# Patient Record
Sex: Male | Born: 1995 | Marital: Single | Smoking: Current some day smoker
Health system: Southern US, Community
[De-identification: ages and names within clinical notes are randomized; demographics above are authoritative.]

## PROBLEM LIST (undated history)

## (undated) DIAGNOSIS — F909 Attention-deficit hyperactivity disorder, unspecified type: Secondary | ICD-10-CM

## (undated) HISTORY — DX: Attention-deficit hyperactivity disorder, unspecified type: F90.9

## (undated) HISTORY — PX: NO PAST SURGERIES: SHX2092

---
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
## 2014-01-21 ENCOUNTER — Institutional Professional Consult (permissible substitution) (INDEPENDENT_AMBULATORY_CARE_PROVIDER_SITE_OTHER): Payer: BC Managed Care – PPO | Admitting: Family

## 2014-01-21 DIAGNOSIS — F913 Oppositional defiant disorder: Secondary | ICD-10-CM

## 2014-01-21 DIAGNOSIS — F909 Attention-deficit hyperactivity disorder, unspecified type: Secondary | ICD-10-CM

## 2019-11-18 ENCOUNTER — Encounter: Payer: Self-pay | Admitting: Family Medicine

## 2019-11-19 ENCOUNTER — Encounter: Payer: Self-pay | Admitting: Family Medicine

## 2021-03-29 ENCOUNTER — Ambulatory Visit (INDEPENDENT_AMBULATORY_CARE_PROVIDER_SITE_OTHER): Payer: Self-pay | Admitting: Nurse Practitioner

## 2021-03-29 ENCOUNTER — Other Ambulatory Visit: Payer: Self-pay

## 2021-03-29 ENCOUNTER — Encounter: Payer: Self-pay | Admitting: Nurse Practitioner

## 2021-03-29 VITALS — BP 126/80 | HR 73 | Temp 99.6°F | Resp 20 | Ht 67.0 in | Wt 132.0 lb

## 2021-03-29 DIAGNOSIS — Z139 Encounter for screening, unspecified: Secondary | ICD-10-CM | POA: Insufficient documentation

## 2021-03-29 DIAGNOSIS — F1113 Opioid abuse with withdrawal: Secondary | ICD-10-CM

## 2021-03-29 DIAGNOSIS — F909 Attention-deficit hyperactivity disorder, unspecified type: Secondary | ICD-10-CM

## 2021-03-29 DIAGNOSIS — Z7689 Persons encountering health services in other specified circumstances: Secondary | ICD-10-CM | POA: Insufficient documentation

## 2021-03-29 NOTE — Progress Notes (Signed)
New Patient Office Visit  Subjective:  Patient ID: Jayse T. Sonnen, male    DOB: Mar 22, 1996  Age: 25 y.o. MRN: [id number]  CC:  Chief Complaint  Patient presents with  . New Patient (Initial Visit)    HPI Kaelum T. Fosse presents for new patient visit. Transferring care from [place] facility. Last physical was 2 years ago Last labs were drawn just prior to his release. He states he had HIV and HCV testing.  He states that he has a mental health provider now named Ms. Green, so he has someone to talk for therapy. He is currently on parole, so he has to have negative drug tests, and he is motivated to stay clean.   Past Medical History:  Diagnosis Date  . ADHD   . ADHD (attention deficit hyperactivity disorder)   . ADHD (attention deficit hyperactivity disorder)     Past Surgical History:  Procedure Laterality Date  . NO PAST SURGERIES      History reviewed. No pertinent family history.  Social History   Socioeconomic History  . Marital status: Single    Spouse name: Not on file  . Number of children: 0  . Years of education: Not on file  . Highest education level: Not on file  Occupational History    Comment: Cabinet making  Tobacco Use  . Smoking status: Current Some Day Smoker    Packs/day: 0.25  . Smokeless tobacco: Never Used  Vaping Use  . Vaping Use: Never used  Substance and Sexual Activity  . Alcohol use: Not Currently    Comment: maybe 2 beers per week  . Drug use: Yes    Types: Heroin    Comment: percocet  . Sexual activity: Yes    Birth control/protection: Condom  Other Topics Concern  . Not on file  Social History Narrative  . Not on file   Social Determinants of Health   Financial Resource Strain: Not on file  Food Insecurity: Not on file  Transportation Needs: Not on file  Physical Activity: Not on file  Stress: Not on file  Social Connections: Not on file  Intimate Partner Violence: Not on file    ROS Review of  Systems  Constitutional: Negative.   Respiratory: Negative.   Cardiovascular: Negative.   Psychiatric/Behavioral: Negative for self-injury and suicidal ideas.       Having mild withdrawal from opioids; requesting suboxone therapy    Objective:   Today's Vitals: BP 126/80   Pulse 73   Temp 99.6 F (37.6 C)   Resp 20   Ht _0  (1.702 m)   Wt 132 lb (59.9 kg)   SpO2 97%   BMI 20.67 kg/m   Physical Exam Constitutional:      Appearance: Normal appearance.  Cardiovascular:     Rate and Rhythm: Normal rate and regular rhythm.     Pulses: Normal pulses.     Heart sounds: Normal heart sounds.  Pulmonary:     Effort: Pulmonary effort is normal.     Breath sounds: Normal breath sounds.  Neurological:     Mental Status: He is alert.  Psychiatric:        Mood and Affect: Mood normal.        Behavior: Behavior normal.        Thought Content: Thought content normal.        Judgment: Judgment normal.     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[place])    -was in prison for 6 years and using opioids there -no that he is out, he does not have a supply and would like to quit -referral to pain mgmt -has therapist currently      Relevant Orders   Ambulatory referral to Pain Clinic     Other   ADHD (attention deficit hyperactivity disorder)    -no current medication -given hx of narcotic abuse, he would need specialist to start this      Encounter to establish care - Primary    -obtain records      Relevant Orders   CBC with Differential/Platelet   Lipid Panel With LDL/HDL Ratio   CMP14+EGFR   Screening due    -will screen for HCV and HIV with next set of labs         Outpatient Encounter Medications as of 03/29/2021  Medication Sig  . [DISCONTINUED] amphetamine-dextroamphetamine (ADDERALL XR) 30 MG 24 hr capsule Take 2 capsules (60 mg total) by mouth every morning.  . [DISCONTINUED]  guanFACINE (INTUNIV) 4 MG TB24 SR tablet Take 1 tablet (4 mg total) by mouth every morning.   No facility-administered encounter medications on file as of 03/29/2021.    Follow-up: Return in about 1 month (around 04/28/2021) for Physical Exam.   Noreene Larsson, NP

## 2021-03-29 NOTE — Assessment & Plan Note (Addendum)
-  was in prison for 6 years and using opioids there -no that he is out, he does not have a supply and would like to quit -referral to pain mgmt -has therapist currently

## 2021-03-29 NOTE — Assessment & Plan Note (Signed)
-  obtain records 

## 2021-03-29 NOTE — Assessment & Plan Note (Signed)
-  no current medication -given hx of narcotic abuse, he would need specialist to start this

## 2021-03-29 NOTE — Patient Instructions (Signed)
Please have fasting labs drawn 2-3 days prior to your appointment so we can discuss the results during your office visit.  

## 2021-03-29 NOTE — Assessment & Plan Note (Signed)
-  will screen for HCV and HIV with next set of labs 

## 2021-04-17 ENCOUNTER — Telehealth: Payer: Self-pay

## 2021-04-17 NOTE — Telephone Encounter (Signed)
Pts mom came in --pt has no insurance, she is paying the bill, she wants to know if the CPE and blood work can be pushed back until he gets some insurance.  Please call [phone] and advise

## 2021-04-17 NOTE — Telephone Encounter (Signed)
Left message

## 2021-04-18 NOTE — Telephone Encounter (Signed)
Pt informed

## 2021-04-26 ENCOUNTER — Encounter: Payer: Self-pay | Admitting: Nurse Practitioner

## 2022-03-17 ENCOUNTER — Encounter: Payer: Self-pay | Admitting: Emergency Medicine

## 2022-03-17 ENCOUNTER — Ambulatory Visit
Admission: EM | Admit: 2022-03-17 | Discharge: 2022-03-17 | Disposition: A | Discharge status: Home / Self Care | Payer: Self-pay | Attending: Family Medicine | Admitting: Family Medicine

## 2022-03-17 DIAGNOSIS — R300 Dysuria: Secondary | ICD-10-CM | POA: Insufficient documentation

## 2022-03-17 DIAGNOSIS — R369 Urethral discharge, unspecified: Secondary | ICD-10-CM | POA: Insufficient documentation

## 2022-03-17 LAB — POCT URINALYSIS DIP (MANUAL ENTRY)
Glucose, UA: NEGATIVE mg/dL
Ketones, POC UA: NEGATIVE mg/dL
Nitrite, UA: NEGATIVE
Protein Ur, POC: 30 mg/dL — AB
Spec Grav, UA: >=1.03 — AB (ref 1.010–1.025)
Urobilinogen, UA: 0.2 E.U./dL
pH, UA: 5.5 (ref 5.0–8.0)

## 2022-03-17 MED ORDER — CEFTRIAXONE SODIUM 500 MG IJ SOLR
500.0000 mg | Freq: Once | INTRAMUSCULAR | Status: AC
  Administered 2022-03-17: 500 mg via INTRAMUSCULAR

## 2022-03-17 MED ORDER — DOXYCYCLINE HYCLATE 100 MG PO CAPS: 100.0000 mg | ORAL_CAPSULE | Freq: Two times a day (BID) | ORAL | 0 refills | Status: DC

## 2022-03-17 NOTE — ED Triage Notes (Signed)
Penile drainage - white and green that started today.  Pain on urination x 2 days.

## 2022-03-17 NOTE — ED Provider Notes (Signed)
[facility]    CSN: [id number] Arrival date & time: 03/17/22  1246      History   Chief Complaint No chief complaint on file.   HPI Makenzie T. Lybrand is a 26 y.o. male.   Presenting today with 2-day history of dysuria, white and green penile discharge.  Denies abdominal pain, fever, chills, nausea, vomiting.  Does have any sexual partner.  No past history of similar issues.  Not trying anything over-the-counter for symptoms.   Past Medical History:  Diagnosis Date   ADHD    ADHD (attention deficit hyperactivity disorder)    ADHD (attention deficit hyperactivity disorder)     Patient Active Problem List   Diagnosis Date Noted   Encounter to establish care 03/29/2021   Screening due 03/29/2021   Opioid abuse with withdrawal (HCC) 03/29/2021   ADHD (attention deficit hyperactivity disorder) 05/08/2013    Past Surgical History:  Procedure Laterality Date   NO PAST SURGERIES         Home Medications    Prior to Admission medications   Medication Sig Start Date End Date Taking? Authorizing Provider  doxycycline (VIBRAMYCIN) 100 MG capsule Take 1 capsule (100 mg total) by mouth 2 (two) times daily. 03/17/22  Yes Particia Nearing, PA-C    Family History History reviewed. No pertinent family history.  Social History Social History   Tobacco Use   Smoking status: Some Days    Packs/day: 0.25    Types: Cigarettes   Smokeless tobacco: Never  Vaping Use   Vaping Use: Every day   Substances: Nicotine, Mixture of cannabinoids  Substance Use Topics   Alcohol use: Not Currently    Comment: maybe 2 beers per week   Drug use: Yes    Types: Methamphetamines    Comment: percocet     Allergies   Patient has no known allergies.   Review of Systems Review of Systems Per HPI  Physical Exam Triage Vital Signs ED Triage Vitals  Enc Vitals Group     BP 03/17/22 1355 114/76     Pulse Rate 03/17/22 1355 97     Resp 03/17/22 1355 18     Temp  03/17/22 1355 98.1 F (36.7 C)     Temp Source 03/17/22 1355 Oral     SpO2 03/17/22 1355 98 %     Weight --      Height --      Head Circumference --      Peak Flow --      Pain Score 03/17/22 1356 0     Pain Loc --      Pain Edu? --      Excl. in GC? --    No data found.  Updated Vital Signs BP 114/76 (BP Location: Right Arm)   Pulse 97   Temp 98.1 F (36.7 C) (Oral)   Resp 18   SpO2 98%   Visual Acuity Right Eye Distance:   Left Eye Distance:   Bilateral Distance:    Right Eye Near:   Left Eye Near:    Bilateral Near:     Physical Exam Vitals and nursing note reviewed.  Constitutional:      Appearance: Normal appearance.  HENT:     Head: Atraumatic.  Eyes:     Extraocular Movements: Extraocular movements intact.     Conjunctiva/sclera: Conjunctivae normal.  Cardiovascular:     Rate and Rhythm: Normal rate and regular rhythm.  Pulmonary:     Effort: Pulmonary  effort is normal.     Breath sounds: Normal breath sounds.  Abdominal:     General: Bowel sounds are normal. There is no distension.     Palpations: Abdomen is soft.     Tenderness: There is no abdominal tenderness. There is no right CVA tenderness, left CVA tenderness or guarding.  Genitourinary:    Comments: GU exam deferred, self swab performed Musculoskeletal:        General: Normal range of motion.     Cervical back: Normal range of motion and neck supple.  Skin:    General: Skin is warm and dry.  Neurological:     General: No focal deficit present.     Mental Status: He is oriented to person, place, and time.  Psychiatric:        Mood and Affect: Mood normal.        Thought Content: Thought content normal.        Judgment: Judgment normal.   UC Treatments / Results  Labs (all labs ordered are listed, but only abnormal results are displayed) Labs Reviewed  POCT URINALYSIS DIP (MANUAL ENTRY) - Abnormal; Notable for the following components:      Result Value   Clarity, UA hazy (*)     Bilirubin, UA small (*)    Spec Grav, UA >=1.030 (*)    Blood, UA small (*)    Protein Ur, POC =30 (*)    Leukocytes, UA Moderate (2+) (*)    All other components within normal limits  CYTOLOGY, (ORAL, ANAL, URETHRAL) ANCILLARY ONLY    EKG   Radiology No results found.  Procedures Procedures (including critical care time)  Medications Ordered in UC Medications  cefTRIAXone (ROCEPHIN) injection 500 mg (has no administration in time range)    Initial Impression / Assessment and Plan / UC Course  I have reviewed the triage vital signs and the nursing notes.  Pertinent labs & imaging results that were available during my care of the patient were reviewed by me and considered in my medical decision making (see chart for details).     Concerning for STD.  While awaiting cytology swab results, will cover for gonorrhea and chlamydia with IM Rocephin, doxycycline.  Discussed abstinence until results return and at least 7 days posttreatment.  Safe sexual practices reviewed.  Final Clinical Impressions(s) / UC Diagnoses   Final diagnoses:  Penile discharge  Dysuria   Discharge Instructions   None    ED Prescriptions     Medication Sig Dispense Auth. Provider   doxycycline (VIBRAMYCIN) 100 MG capsule Take 1 capsule (100 mg total) by mouth 2 (two) times daily. 14 capsule Particia Nearing, [place]      PDMP not reviewed this encounter.   Particia Nearing, [place] 03/17/22 1427

## 2022-03-20 LAB — CYTOLOGY, (ORAL, ANAL, URETHRAL) ANCILLARY ONLY
Chlamydia: NEGATIVE
Comment: NEGATIVE
Comment: NEGATIVE
Comment: NORMAL
Neisseria Gonorrhea: POSITIVE — AB
Trichomonas: NEGATIVE

## 2022-04-02 ENCOUNTER — Ambulatory Visit
Admission: EM | Admit: 2022-04-02 | Discharge: 2022-04-02 | Disposition: A | Discharge status: Home / Self Care | Payer: Self-pay | Attending: Nurse Practitioner | Admitting: Nurse Practitioner

## 2022-04-02 DIAGNOSIS — N342 Other urethritis: Secondary | ICD-10-CM | POA: Insufficient documentation

## 2022-04-02 DIAGNOSIS — S71112A Laceration without foreign body, left thigh, initial encounter: Secondary | ICD-10-CM | POA: Insufficient documentation

## 2022-04-02 MED ORDER — DOXYCYCLINE HYCLATE 100 MG PO CAPS: 100.0000 mg | ORAL_CAPSULE | Freq: Two times a day (BID) | ORAL | 0 refills | Status: DC
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
## 2022-04-02 NOTE — Discharge Instructions (Signed)
-   We will let you know if the swab comes back positive for anything; please start doxycycline 100 mg twice daily for 7 days in the meantime -This will also treat the laceration of your thigh if there is any infection in there -Please use condoms with every sexual encounter to prevent the spread of STI
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
## 2022-04-04 ENCOUNTER — Telehealth (HOSPITAL_COMMUNITY): Payer: Self-pay | Admitting: Emergency Medicine

## 2022-04-04 LAB — CYTOLOGY, (ORAL, ANAL, URETHRAL) ANCILLARY ONLY
Chlamydia: NEGATIVE
Comment: NEGATIVE
Comment: NEGATIVE
Comment: NORMAL
Neisseria Gonorrhea: POSITIVE — AB
Trichomonas: NEGATIVE

## 2022-04-04 NOTE — Telephone Encounter (Signed)
Per protocol, patient will need treatment with IM Rocephin  500 mg for positive Gonorrhea Contacted patient by phone.  Verified identity using two identifiers.  Provided positive result.  Reviewed safe sex practices, notifying partners, and refraining from sexual activities for 7 days from time of treatment.  Patient verified understanding, all questions answered.   [facility] notified 

## 2022-04-16 ENCOUNTER — Ambulatory Visit
Admission: EM | Admit: 2022-04-16 | Discharge: 2022-04-16 | Disposition: A | Discharge status: Home / Self Care | Payer: Self-pay | Attending: Nurse Practitioner | Admitting: Nurse Practitioner

## 2022-04-16 DIAGNOSIS — A549 Gonococcal infection, unspecified: Secondary | ICD-10-CM | POA: Insufficient documentation

## 2022-04-16 DIAGNOSIS — R369 Urethral discharge, unspecified: Secondary | ICD-10-CM | POA: Insufficient documentation

## 2022-04-16 MED ORDER — CEFTRIAXONE SODIUM 500 MG IJ SOLR
500.0000 mg | Freq: Once | INTRAMUSCULAR | Status: AC
  Administered 2022-04-16: 500 mg via INTRAMUSCULAR

## 2022-04-16 NOTE — ED Triage Notes (Signed)
Pt returns with continued std symptoms and claims he now has bleeding from penis

## 2022-04-17 LAB — CYTOLOGY, (ORAL, ANAL, URETHRAL) ANCILLARY ONLY
Chlamydia: NEGATIVE
Comment: NEGATIVE
Comment: NEGATIVE
Comment: NORMAL
Neisseria Gonorrhea: POSITIVE — AB
Trichomonas: NEGATIVE

## 2023-12-03 ENCOUNTER — Ambulatory Visit
Admission: EM | Admit: 2023-12-03 | Discharge: 2023-12-03 | Disposition: A | Discharge status: Home / Self Care | Payer: Medicaid Other | Attending: Nurse Practitioner | Admitting: Nurse Practitioner

## 2023-12-03 ENCOUNTER — Encounter: Payer: Self-pay | Admitting: Emergency Medicine

## 2023-12-03 DIAGNOSIS — J069 Acute upper respiratory infection, unspecified: Secondary | ICD-10-CM

## 2023-12-03 LAB — POCT INFLUENZA A/B
Influenza A, POC: NEGATIVE
Influenza B, POC: NEGATIVE

## 2023-12-03 MED ORDER — BENZONATATE 100 MG PO CAPS: 100.0000 mg | ORAL_CAPSULE | Freq: Three times a day (TID) | ORAL | 0 refills | Status: AC | PRN

## 2023-12-03 MED ORDER — LIDOCAINE VISCOUS HCL 2 % MT SOLN: 15.0000 mL | OROMUCOSAL | 0 refills | Status: AC | PRN

## 2023-12-03 NOTE — ED Provider Notes (Signed)
[facility]    CSN: [id number] Arrival date & time: 12/03/23  1008      History   Chief Complaint No chief complaint on file.   HPI Gregory Frey is a 28 y.o. male.   Patient presents today with 2-day history of bodyaches, night sweats, chills, congested and dry cough, runny nose, sore throat, headache, decreased appetite, and fatigue.  No fever, shortness of breath, chest pain, stuffy nose, ear pain, abdominal pain, nausea/vomiting, or diarrhea.  Has not take anything for symptoms so far.  He works at [employer] and thinks he is may have been exposed to sick coworkers.    Past Medical History:  Diagnosis Date   ADHD    ADHD (attention deficit hyperactivity disorder)    ADHD (attention deficit hyperactivity disorder)     Patient Active Problem List   Diagnosis Date Noted   Encounter to establish care 03/29/2021   Screening due 03/29/2021   Opioid abuse with withdrawal (HCC) 03/29/2021   ADHD (attention deficit hyperactivity disorder) 05/08/2013    Past Surgical History:  Procedure Laterality Date   NO PAST SURGERIES         Home Medications    Prior to Admission medications   Medication Sig Start Date End Date Taking? Authorizing Provider  benzonatate (TESSALON) 100 MG capsule Take 1 capsule (100 mg total) by mouth 3 (three) times daily as needed for cough. Do not take with alcohol or while operating or driving heavy machinery 5/63/87  Yes Cathlean Marseilles A, NP  lidocaine (XYLOCAINE) 2 % solution Use as directed 15 mLs in the mouth or throat as needed for mouth pain. Gargle and spit as needed for throat pain 12/03/23  Yes Valentino Nose, NP    Family History History reviewed. No pertinent family history.  Social History Social History   Tobacco Use   Smoking status: Some Days    Current packs/day: 0.25    Types: Cigarettes   Smokeless tobacco: Never  Vaping Use   Vaping status: Every Day   Substances: Nicotine, Mixture of  cannabinoids  Substance Use Topics   Alcohol use: Not Currently    Comment: maybe 2 beers per week   Drug use: Yes    Types: Methamphetamines    Comment: percocet     Allergies   Patient has no known allergies.   Review of Systems Review of Systems Per HPI  Physical Exam Triage Vital Signs ED Triage Vitals  Encounter Vitals Group     BP 12/03/23 1021 103/66     Systolic BP Percentile --      Diastolic BP Percentile --      Pulse Rate 12/03/23 1021 79     Resp 12/03/23 1021 18     Temp 12/03/23 1021 98 F (36.7 C)     Temp Source 12/03/23 1021 Oral     SpO2 12/03/23 1021 99 %     Weight --      Height --      Head Circumference --      Peak Flow --      Pain Score 12/03/23 1023 6     Pain Loc --      Pain Education --      Exclude from Growth Chart --    No data found.  Updated Vital Signs BP 103/66 (BP Location: Right Arm)   Pulse 79   Temp 98 F (36.7 C) (Oral)   Resp 18   SpO2 99%  Visual Acuity Right Eye Distance:   Left Eye Distance:   Bilateral Distance:    Right Eye Near:   Left Eye Near:    Bilateral Near:     Physical Exam Vitals and nursing note reviewed.  Constitutional:      General: He is not in acute distress.    Appearance: Normal appearance. He is not ill-appearing or toxic-appearing.  HENT:     Head: Normocephalic and atraumatic.     Right Ear: Tympanic membrane, ear canal and external ear normal.     Left Ear: Tympanic membrane, ear canal and external ear normal.     Nose: No congestion or rhinorrhea.     Mouth/Throat:     Mouth: Mucous membranes are moist.     Pharynx: Oropharynx is clear. No oropharyngeal exudate or posterior oropharyngeal erythema.  Eyes:     General: No scleral icterus.    Extraocular Movements: Extraocular movements intact.  Cardiovascular:     Rate and Rhythm: Normal rate and regular rhythm.  Pulmonary:     Effort: Pulmonary effort is normal. No respiratory distress.     Breath sounds: Normal  breath sounds. No wheezing, rhonchi or rales.  Musculoskeletal:     Cervical back: Normal range of motion and neck supple.  Lymphadenopathy:     Cervical: No cervical adenopathy.  Skin:    General: Skin is warm and dry.     Coloration: Skin is not jaundiced or pale.     Findings: No erythema or rash.  Neurological:     Mental Status: He is alert and oriented to person, place, and time.  Psychiatric:        Behavior: Behavior is cooperative.      UC Treatments / Results  Labs (all labs ordered are listed, but only abnormal results are displayed) Labs Reviewed  POCT INFLUENZA A/B - Normal    EKG   Radiology No results found.  Procedures Procedures (including critical care time)  Medications Ordered in UC Medications - No data to display  Initial Impression / Assessment and Plan / UC Course  I have reviewed the triage vital signs and the nursing notes.  Pertinent labs & imaging results that were available during my care of the patient were reviewed by me and considered in my medical decision making (see chart for details).   Patient is well-appearing, normotensive, afebrile, not tachycardic, not tachypneic, oxygenating well on room air.    1. Viral URI with cough Vitals and exam are reassuring today Suspect viral etiology Influenza test is negative Supportive care discussed with patient ER and return precautions discussed Work excuse provided  The patient was given the opportunity to ask questions.  All questions answered to their satisfaction.  The patient is in agreement to this plan.    Final Clinical Impressions(s) / UC Diagnoses   Final diagnoses:  Viral URI with cough     Discharge Instructions      You have a viral upper respiratory infection.  Symptoms should improve over the next week to 10 days.  If you develop chest pain or shortness of breath, go to the emergency room.  Influenza test is negative today.  Some things that can make you feel  better are: - Increased rest - Increasing fluid with water/sugar free electrolytes - Acetaminophen and ibuprofen as needed for fever/pain - Salt water gargling, chloraseptic spray and throat lozenges and lidocaine rinses as needed for throat pain - OTC guaifenesin (Mucinex) 600 mg twice daily -  Saline sinus flushes or a neti pot - Humidifying the air -Tessalon Perles every 8 hours as needed for dry cough      ED Prescriptions     Medication Sig Dispense Auth. Provider   lidocaine (XYLOCAINE) 2 % solution Use as directed 15 mLs in the mouth or throat as needed for mouth pain. Gargle and spit as needed for throat pain 100 mL Cathlean Marseilles A, NP   benzonatate (TESSALON) 100 MG capsule Take 1 capsule (100 mg total) by mouth 3 (three) times daily as needed for cough. Do not take with alcohol or while operating or driving heavy machinery 21 capsule Valentino Nose, NP      PDMP not reviewed this encounter.   Valentino Nose, NP 12/03/23 [phone]

## 2023-12-03 NOTE — Discharge Instructions (Signed)
You have a viral upper respiratory infection.  Symptoms should improve over the next week to 10 days.  If you develop chest pain or shortness of breath, go to the emergency room.  Influenza test is negative today.  Some things that can make you feel better are: - Increased rest - Increasing fluid with water/sugar free electrolytes - Acetaminophen and ibuprofen as needed for fever/pain - Salt water gargling, chloraseptic spray and throat lozenges and lidocaine rinses as needed for throat pain - OTC guaifenesin (Mucinex) 600 mg twice daily - Saline sinus flushes or a neti pot - Humidifying the air -Tessalon Perles every 8 hours as needed for dry cough

## 2023-12-03 NOTE — ED Triage Notes (Signed)
Cough since yesterday with chills and body aches.

## 2024-01-20 ENCOUNTER — Other Ambulatory Visit: Payer: Self-pay
